# Patient Record
Sex: Male | Born: 1965 | Race: White | Hispanic: No | Marital: Married | Smoking: Never smoker
Health system: Southern US, Community
[De-identification: ages and names within clinical notes are randomized; demographics above are authoritative.]

## PROBLEM LIST (undated history)

## (undated) DIAGNOSIS — M1990 Unspecified osteoarthritis, unspecified site: Secondary | ICD-10-CM

## (undated) DIAGNOSIS — I10 Essential (primary) hypertension: Secondary | ICD-10-CM

---

## 2019-10-01 ENCOUNTER — Encounter (HOSPITAL_COMMUNITY): Payer: Self-pay | Admitting: Emergency Medicine

## 2019-10-01 ENCOUNTER — Emergency Department (HOSPITAL_COMMUNITY): Payer: No Typology Code available for payment source

## 2019-10-01 ENCOUNTER — Other Ambulatory Visit: Payer: Self-pay

## 2019-10-01 ENCOUNTER — Emergency Department (HOSPITAL_COMMUNITY)
Admission: EM | Admit: 2019-10-01 | Discharge: 2019-10-02 | Disposition: A | Discharge status: Home / Self Care | Payer: No Typology Code available for payment source | Attending: Emergency Medicine | Admitting: Emergency Medicine

## 2019-10-01 DIAGNOSIS — E669 Obesity, unspecified: Secondary | ICD-10-CM | POA: Insufficient documentation

## 2019-10-01 DIAGNOSIS — Y92149 Unspecified place in prison as the place of occurrence of the external cause: Secondary | ICD-10-CM | POA: Diagnosis not present

## 2019-10-01 DIAGNOSIS — Y9389 Activity, other specified: Secondary | ICD-10-CM | POA: Insufficient documentation

## 2019-10-01 DIAGNOSIS — Z6835 Body mass index (BMI) 35.0-35.9, adult: Secondary | ICD-10-CM | POA: Insufficient documentation

## 2019-10-01 DIAGNOSIS — Z20828 Contact with and (suspected) exposure to other viral communicable diseases: Secondary | ICD-10-CM | POA: Insufficient documentation

## 2019-10-01 DIAGNOSIS — I10 Essential (primary) hypertension: Secondary | ICD-10-CM | POA: Diagnosis not present

## 2019-10-01 DIAGNOSIS — S3210XA Unspecified fracture of sacrum, initial encounter for closed fracture: Secondary | ICD-10-CM | POA: Insufficient documentation

## 2019-10-01 DIAGNOSIS — Y990 Civilian activity done for income or pay: Secondary | ICD-10-CM | POA: Diagnosis not present

## 2019-10-01 DIAGNOSIS — S3992XA Unspecified injury of lower back, initial encounter: Secondary | ICD-10-CM | POA: Diagnosis present

## 2019-10-01 DIAGNOSIS — W19XXXA Unspecified fall, initial encounter: Secondary | ICD-10-CM

## 2019-10-01 DIAGNOSIS — R29898 Other symptoms and signs involving the musculoskeletal system: Secondary | ICD-10-CM

## 2019-10-01 HISTORY — DX: Unspecified osteoarthritis, unspecified site: M19.90

## 2019-10-01 HISTORY — DX: Essential (primary) hypertension: I10

## 2019-10-01 MED ORDER — OXYCODONE-ACETAMINOPHEN 5-325 MG PO TABS
2.0000 | ORAL_TABLET | Freq: Once | ORAL | Status: AC
  Administered 2019-10-01: 2 via ORAL
  Filled 2019-10-01: qty 2
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
## 2019-10-02 ENCOUNTER — Emergency Department (HOSPITAL_COMMUNITY): Payer: No Typology Code available for payment source

## 2019-10-02 DIAGNOSIS — Y92149 Unspecified place in prison as the place of occurrence of the external cause: Secondary | ICD-10-CM | POA: Diagnosis not present

## 2019-10-02 DIAGNOSIS — Z6835 Body mass index (BMI) 35.0-35.9, adult: Secondary | ICD-10-CM | POA: Diagnosis not present

## 2019-10-02 DIAGNOSIS — Y990 Civilian activity done for income or pay: Secondary | ICD-10-CM | POA: Diagnosis not present

## 2019-10-02 DIAGNOSIS — I10 Essential (primary) hypertension: Secondary | ICD-10-CM | POA: Diagnosis not present

## 2019-10-02 DIAGNOSIS — S3992XA Unspecified injury of lower back, initial encounter: Secondary | ICD-10-CM | POA: Diagnosis present

## 2019-10-02 DIAGNOSIS — Z20828 Contact with and (suspected) exposure to other viral communicable diseases: Secondary | ICD-10-CM | POA: Diagnosis not present

## 2019-10-02 DIAGNOSIS — Y9389 Activity, other specified: Secondary | ICD-10-CM | POA: Diagnosis not present

## 2019-10-02 DIAGNOSIS — S3210XA Unspecified fracture of sacrum, initial encounter for closed fracture: Secondary | ICD-10-CM | POA: Diagnosis not present

## 2019-10-02 DIAGNOSIS — E669 Obesity, unspecified: Secondary | ICD-10-CM | POA: Diagnosis not present

## 2019-10-02 LAB — SARS CORONAVIRUS 2 (TAT 6-24 HRS): SARS Coronavirus 2: NEGATIVE

## 2019-10-02 MED ORDER — OXYCODONE-ACETAMINOPHEN 5-325 MG PO TABS: 1.0000 | ORAL_TABLET | ORAL | 0 refills | Status: AC | PRN

## 2019-10-02 MED ORDER — OXYCODONE-ACETAMINOPHEN 5-325 MG PO TABS
1.0000 | ORAL_TABLET | Freq: Once | ORAL | Status: AC
  Administered 2019-10-02: 08:00:00 1 via ORAL
  Filled 2019-10-02: qty 1

## 2019-10-02 NOTE — ED Notes (Addendum)
No urine sample given  At this time
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
## 2019-10-02 NOTE — ED Notes (Signed)
Patient verbalizes understanding of discharge instructions. Opportunity for questioning and answers were provided. Armband removed by staff, pt discharged from ED. Wife at bedside.

## 2019-10-02 NOTE — ED Notes (Addendum)
Paged [facility]- They will have to order brace which may take a two hours or so.

## 2019-10-02 NOTE — Progress Notes (Signed)
[facility] Progress Note Patient Details:  Francis Rivera 12-16-1965 [id number]  Patient ID: Francis Rivera, male   DOB: [birth year], 53 y.o.   MRN: [id number]   Francis Rivera 10/02/2019, 7:34 AMCalled [facility] for TLSO brace.
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
## 2020-08-05 IMAGING — MR MR LUMBAR SPINE W/O CM
4 of 5 series · 26 of 48 positions shown · non-contrast
Comparison: CT scan 10/01/2019

CLINICAL DATA: Fell. Low back and buttock pain with weakness and
numbness in legs.

EXAM:
MRI LUMBAR SPINE WITHOUT CONTRAST
TECHNIQUE: Multiplanar, multisequence MR imaging of the lumbar spine was
performed. No intravenous contrast was administered.

[Series 5: T2 · sagittal · 4.0mm · 0.73mm/px · 6 of 16 slices shown (1 of 2)]
[im 1/16]
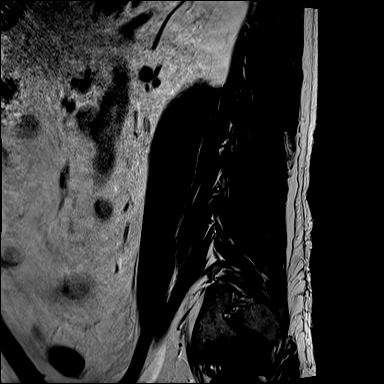
[im 4/16]
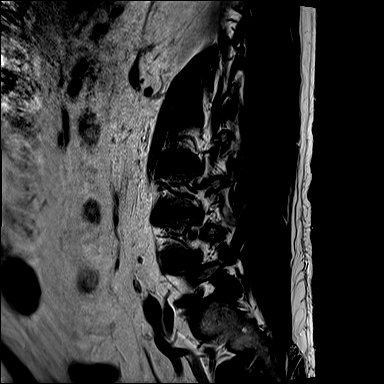
[im 7/16]
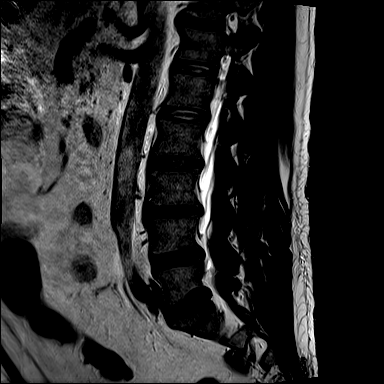
[im 10/16]
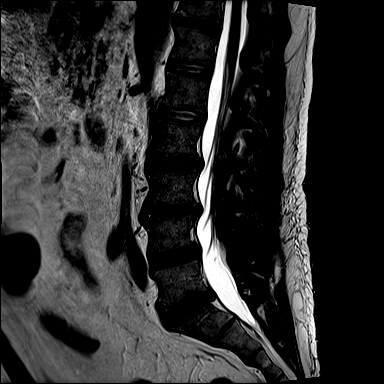
[im 13/16]
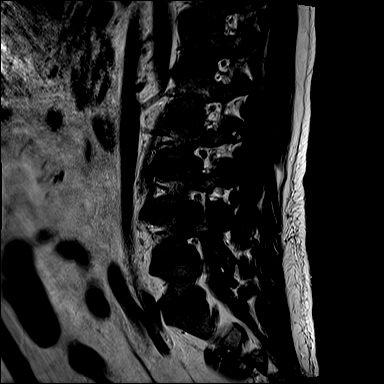
[im 16/16]
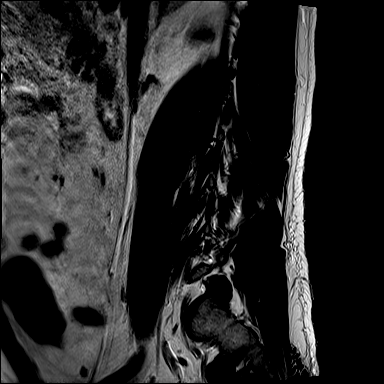

[Series 7: T1 · sagittal · 4.0mm · 0.88mm/px · 7 of 16 slices shown (1 of 2)]
[im 1/16]
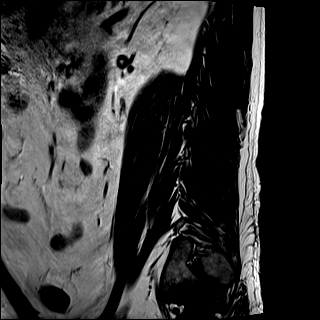
[im 3/16]
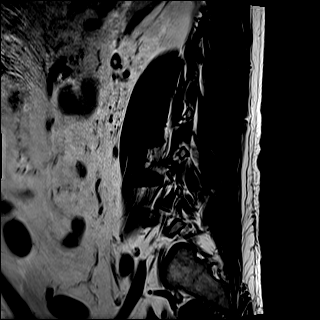
[im 6/16]
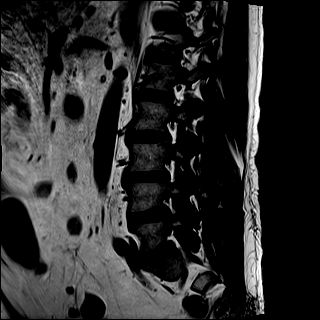
[im 8/16]
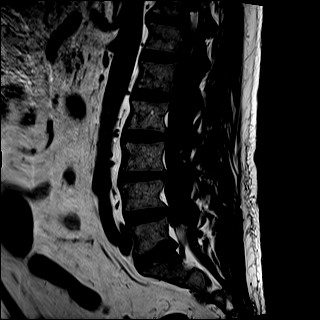
[im 11/16]
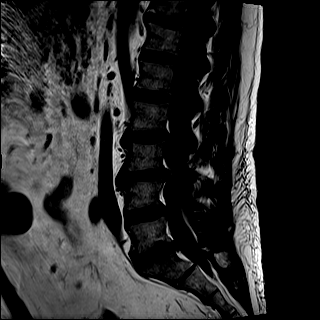
[im 13/16]
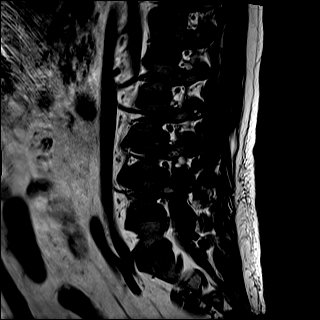
[im 16/16]
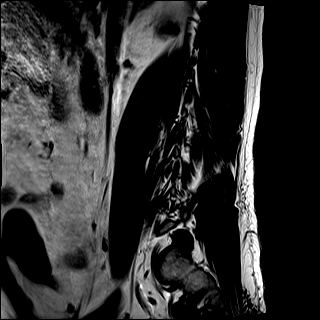

[Series 10: T2 · axial · 4.0mm · 0.57mm/px · z∈[-86,+105]mm · 8 of 32 slices shown (2 of 2)]
[im 1/32]
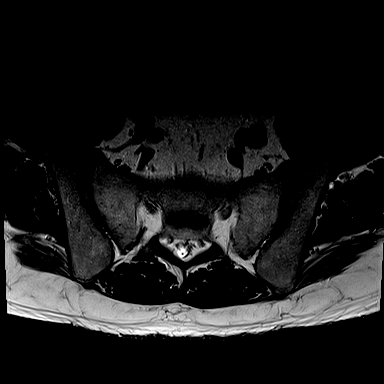
[im 5/32]
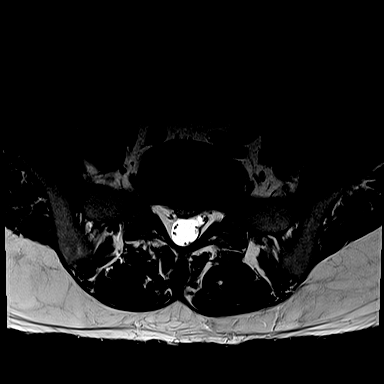
[im 10/32]
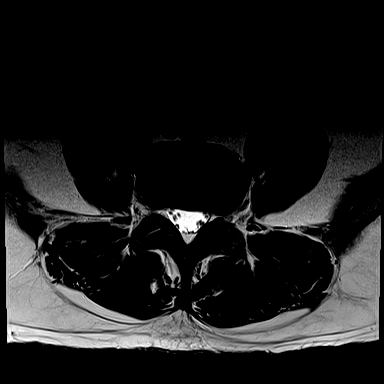
[im 15/32]
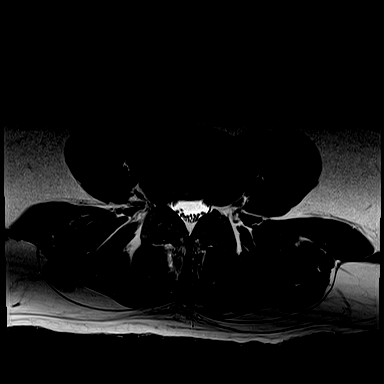
[im 17/32]
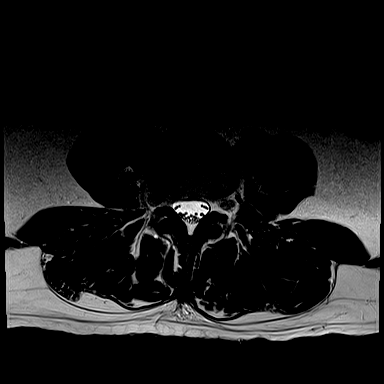
[im 22/32]
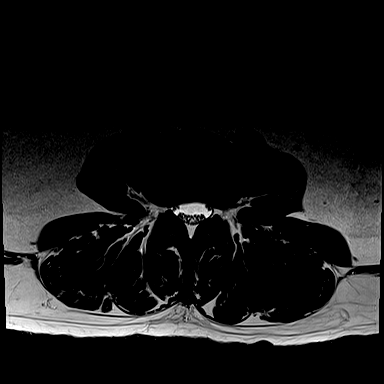
[im 27/32]
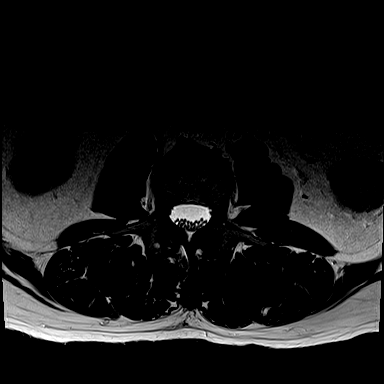
[im 32/32]
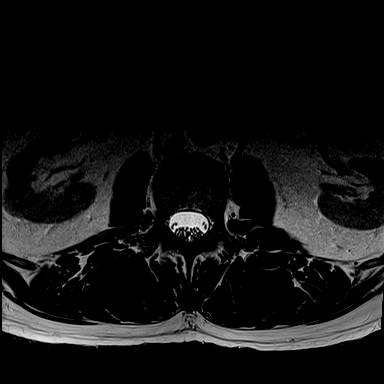

[Series 11: T1 · axial · 4.0mm · 0.34mm/px · z∈[-86,+80]mm · 5 of 32 slices shown (2 of 2)]
[im 1/32]
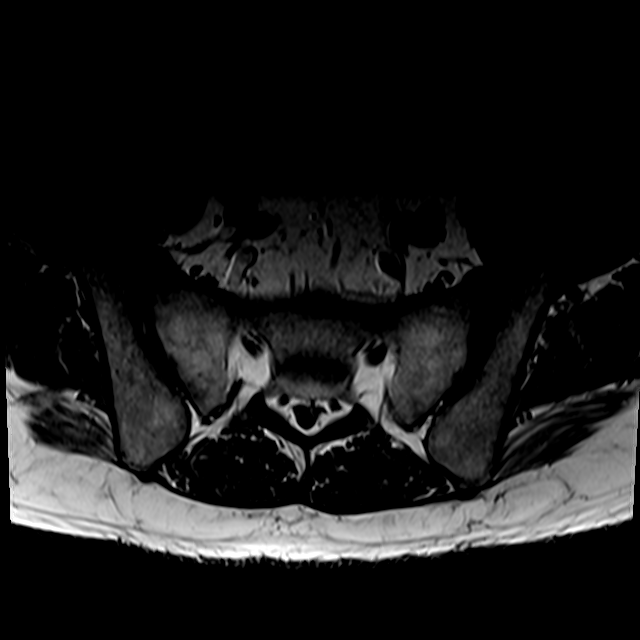
[im 5/32]
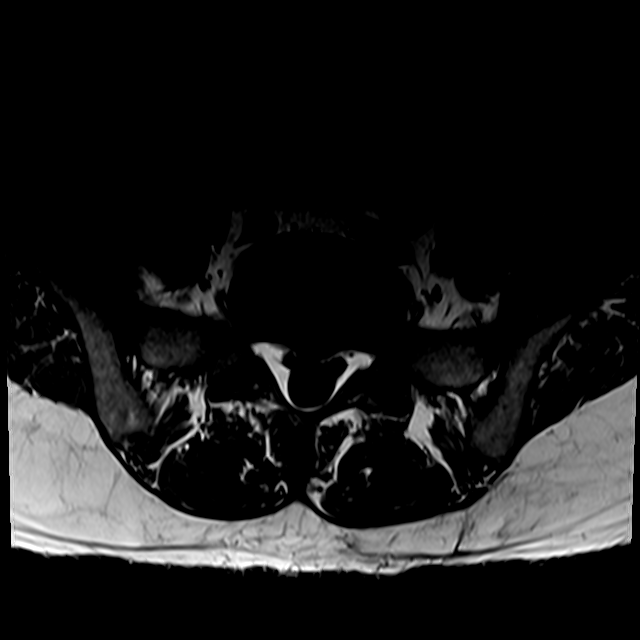
[im 10/32]
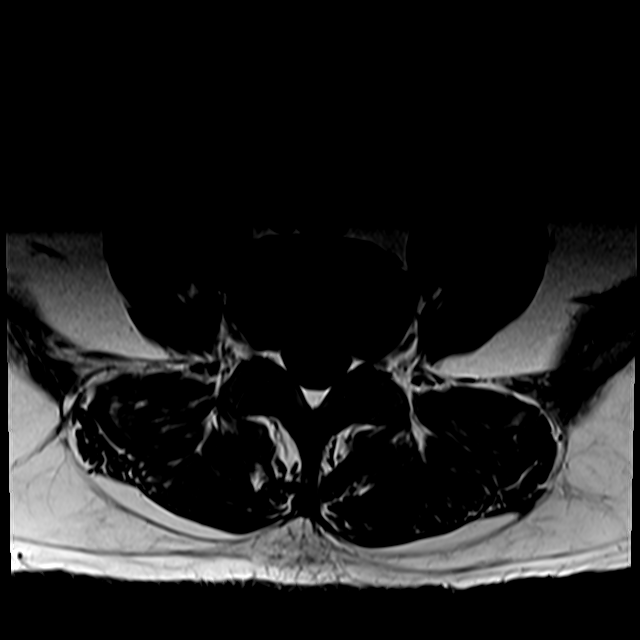
[im 17/32]
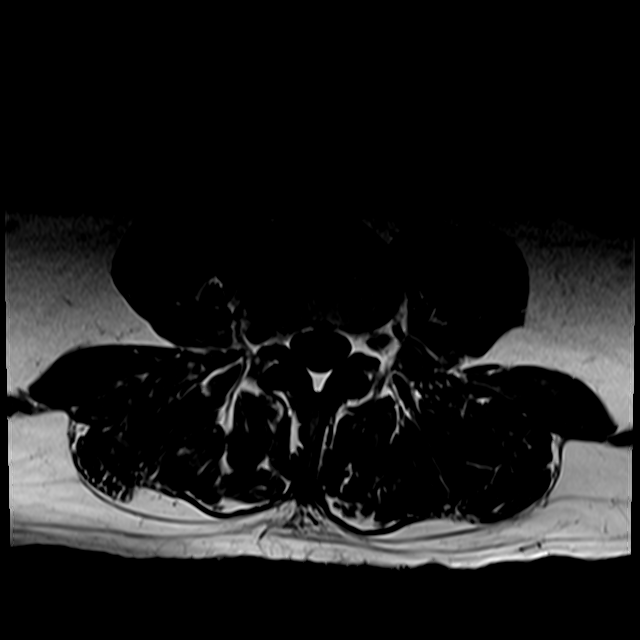
[im 27/32]
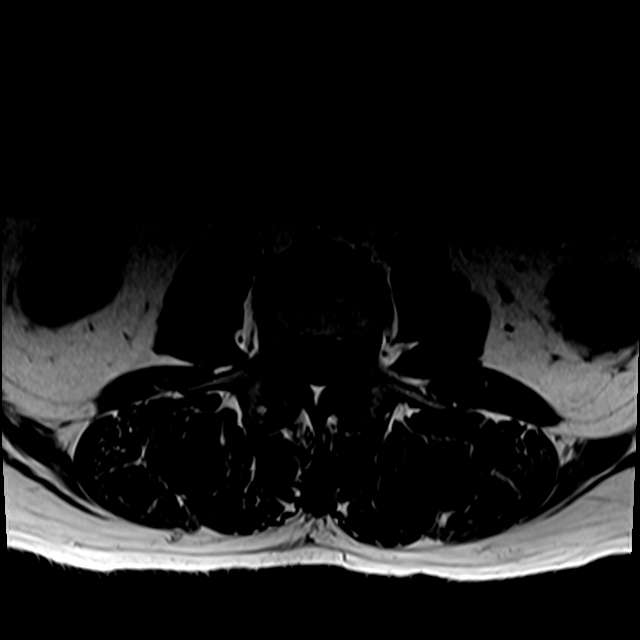

[26 of 48 positions shown; findings below may reference images not displayed]

FINDINGS: Segmentation: There are five lumbar type vertebral bodies. The last
full intervertebral disc space is labeled L5-S1. This correlates
with the CT scan.

Alignment:  Normal

Vertebrae:  Normal marrow signal.  No fracture or bone lesion.

Conus medullaris and cauda equina: Conus extends to the T12-L1
level. Conus and cauda equina appear normal.

Paraspinal and other soft tissues: No significant paraspinal or
retroperitoneal findings are identified.

Disc levels:

L1-2: No significant findings.

L2-3: Diffuse annular bulge with mild bilateral lateral recess
encroachment, right greater than left. There is also a shallow
broad-based lateral foraminal/extraforaminal on the right
potentially irritating the extraforaminal right L2 nerve root.

L3-4: Annular fissure and shallow central disc protrusion along with
a diffuse bulging annulus. There is flattening of the ventral thecal
sac and mild bilateral lateral recess stenosis, right greater than
left. There is also a lateral foraminal and extraforaminal disc
protrusion on the right with potential irritation of the right L3
nerve root.

L4-5: Shallow central disc protrusion with minimal impression on the
ventral thecal sac. No spinal or foraminal stenosis. Mild facet
disease.

L5-S1: Focal central disc protrusion barely contacting the ventral
thecal sac and comes close to contacting the right S1 nerve root. No
foraminal stenosis.
IMPRESSION: 1. Multilevel disc disease as detailed above. No acute bony
findings.
2. Shallow broad-based lateral foraminal and extraforaminal disc
protrusion on the right at L2-3 potentially irritating the
extraforaminal right L2 nerve root.
3. Annular fissure and shallow central disc protrusion at L3-4.
There is also a lateral foraminal/extraforaminal disc protrusion on
the right with potential irritation of the right L3 nerve root.
4. Shallow central disc protrusion at L4-5 without significant
spinal or foraminal stenosis.
5. Focal central disc protrusion at L5-S1.
